# Patient Record
Sex: Male | Born: 1970 | Race: White | Hispanic: No | Smoking: Current some day smoker
Health system: Southern US, Community
[De-identification: ages and names within clinical notes are randomized; demographics above are authoritative.]

## PROBLEM LIST (undated history)

## (undated) DIAGNOSIS — J45909 Unspecified asthma, uncomplicated: Secondary | ICD-10-CM

## (undated) DIAGNOSIS — G4730 Sleep apnea, unspecified: Secondary | ICD-10-CM

## (undated) DIAGNOSIS — I10 Essential (primary) hypertension: Secondary | ICD-10-CM

---

## 1999-12-24 ENCOUNTER — Emergency Department (HOSPITAL_COMMUNITY): Admission: EM | Admit: 1999-12-24 | Discharge: 1999-12-24 | Payer: Self-pay | Admitting: Emergency Medicine

## 2000-07-24 ENCOUNTER — Emergency Department (HOSPITAL_COMMUNITY): Admission: EM | Admit: 2000-07-24 | Discharge: 2000-07-24 | Payer: Self-pay | Admitting: Emergency Medicine

## 2008-06-13 ENCOUNTER — Emergency Department (HOSPITAL_COMMUNITY): Admission: EM | Admit: 2008-06-13 | Discharge: 2008-06-13 | Payer: Self-pay | Admitting: Emergency Medicine

## 2009-05-09 ENCOUNTER — Emergency Department (HOSPITAL_COMMUNITY): Admission: EM | Admit: 2009-05-09 | Discharge: 2009-05-09 | Payer: Self-pay | Admitting: Emergency Medicine

## 2010-03-28 ENCOUNTER — Emergency Department (HOSPITAL_COMMUNITY)
Admission: EM | Admit: 2010-03-28 | Discharge: 2010-03-28 | Payer: Self-pay | Source: Home / Self Care | Admitting: Emergency Medicine

## 2011-08-21 ENCOUNTER — Encounter (HOSPITAL_COMMUNITY): Payer: Self-pay | Admitting: *Deleted

## 2011-08-21 ENCOUNTER — Emergency Department (HOSPITAL_COMMUNITY)
Admission: EM | Admit: 2011-08-21 | Discharge: 2011-08-21 | Disposition: A | Discharge status: Home / Self Care | Payer: Medicaid Other | Attending: Emergency Medicine | Admitting: Emergency Medicine

## 2011-08-21 DIAGNOSIS — J069 Acute upper respiratory infection, unspecified: Secondary | ICD-10-CM

## 2011-08-21 DIAGNOSIS — Z87891 Personal history of nicotine dependence: Secondary | ICD-10-CM | POA: Insufficient documentation

## 2011-08-21 DIAGNOSIS — J3489 Other specified disorders of nose and nasal sinuses: Secondary | ICD-10-CM | POA: Insufficient documentation

## 2011-08-21 DIAGNOSIS — R05 Cough: Secondary | ICD-10-CM | POA: Insufficient documentation

## 2011-08-21 DIAGNOSIS — J40 Bronchitis, not specified as acute or chronic: Secondary | ICD-10-CM

## 2011-08-21 DIAGNOSIS — R059 Cough, unspecified: Secondary | ICD-10-CM | POA: Insufficient documentation

## 2011-08-21 MED ORDER — AZITHROMYCIN 250 MG PO TABS: 500.0000 mg | ORAL_TABLET | Freq: Every day | ORAL | Status: AC

## 2011-08-21 MED ORDER — OXYMETAZOLINE HCL 0.05 % NA SOLN: 2.0000 | Freq: Two times a day (BID) | NASAL | Status: AC

## 2011-08-21 NOTE — ED Provider Notes (Signed)
History     CSN: [id number]  Arrival date & time 08/21/11  1313   First MD Initiated Contact with Patient 08/21/11 1617      Chief Complaint  Patient presents with  . Nasal Congestion  . Cough    (Consider location/radiation/quality/duration/timing/severity/associated sxs/prior treatment) HPI  41 year old male presents with chief complaints of cold symptoms.  Onset was gradual, persistent, and worsening. Symptoms include sinus headache, nasal congestion, sore throat, ear aches, sneezing, coughing productive with yellow sputum, nausea without vomiting or diarrhea. She also complains of chest congestion. He denies fever, chills, myalgias, abdominal pain, back pain or dysuria or rash. Patient has tried Mucinex with minimal improvement. Patient is a smoker but has tried to quit. Patient denies any recent sick contact. Patient denies hemoptysis.   History reviewed. No pertinent past medical history.  History reviewed. No pertinent past surgical history.  No family history on file.  History  Substance Use Topics  . Smoking status: Former Smoker    Quit date: 06/22/2011  . Smokeless tobacco: Not on file  . Alcohol Use: Yes     occasionally      Review of Systems  All other systems reviewed and are negative.    Allergies  Aspirin and Ibuprofen  Home Medications   Current Outpatient Rx  Name Route Sig Dispense Refill  . FEXOFENADINE HCL 180 MG PO TABS Oral Take 180 mg by mouth daily.    . GUAIFENESIN ER 600 MG PO TB12 Oral Take 600 mg by mouth daily.      BP 146/78  Pulse 86  Temp(Src) 98.2 F (36.8 C) (Oral)  SpO2 98%  Physical Exam  Nursing note and vitals reviewed. Constitutional: He appears well-developed and well-nourished. No distress.       Awake, alert, nontoxic appearance  HENT:  Head: Atraumatic. No trismus in the jaw.  Right Ear: Tympanic membrane is injected.  Left Ear: Tympanic membrane is injected.  Nose: Rhinorrhea present. No sinus  tenderness. No epistaxis. Right sinus exhibits frontal sinus tenderness. Left sinus exhibits frontal sinus tenderness.  Mouth/Throat: Uvula is midline and mucous membranes are normal. No uvula swelling. Posterior oropharyngeal erythema present.  Eyes: Conjunctivae are normal. Right eye exhibits no discharge. Left eye exhibits no discharge.  Neck: Normal range of motion. Neck supple.  Cardiovascular: Normal rate and regular rhythm.   Pulmonary/Chest: Effort normal. No respiratory distress. He exhibits no tenderness.  Abdominal: Soft. There is no tenderness. There is no rebound.  Musculoskeletal: He exhibits no tenderness.       ROM appears intact, no obvious focal weakness  Lymphadenopathy:    He has no cervical adenopathy.  Neurological: He is alert.  Skin: Skin is warm and dry. No rash noted.  Psychiatric: He has a normal mood and affect.    ED Course  Procedures (including critical care time)  Labs Reviewed - No data to display No results found.   No diagnosis found.    MDM  Symptoms is suggestive of upper respiratory infection. Patient is afebrile. Since he is a smoker, plan to prescribe Z-Pak. Treatment include gargle with salt water, afrin for nasal congestion.  Pt voice understanding.          Fayrene Helper, PA-C 08/21/11 1630

## 2011-08-21 NOTE — Discharge Instructions (Signed)
Bronchitis Bronchitis is the body's way of reacting to injury and/or infection (inflammation) of the bronchi. Bronchi are the air tubes that extend from the windpipe into the lungs. If the inflammation becomes severe, it may cause shortness of breath. CAUSES  Inflammation may be caused by:  A virus.   Germs (bacteria).   Dust.   Allergens.   Pollutants and many other irritants.  The cells lining the bronchial tree are covered with tiny hairs (cilia). These constantly beat upward, away from the lungs, toward the mouth. This keeps the lungs free of pollutants. When these cells become too irritated and are unable to do their job, mucus begins to develop. This causes the characteristic cough of bronchitis. The cough clears the lungs when the cilia are unable to do their job. Without either of these protective mechanisms, the mucus would settle in the lungs. Then you would develop pneumonia. Smoking is a common cause of bronchitis and can contribute to pneumonia. Stopping this habit is the single most important thing you can do to help yourself. TREATMENT   Your caregiver may prescribe an antibiotic if the cough is caused by bacteria. Also, medicines that open up your airways make it easier to breathe. Your caregiver may also recommend or prescribe an expectorant. It will loosen the mucus to be coughed up. Only take over-the-counter or prescription medicines for pain, discomfort, or fever as directed by your caregiver.   Removing whatever causes the problem (smoking, for example) is critical to preventing the problem from getting worse.   Cough suppressants may be prescribed for relief of cough symptoms.   Inhaled medicines may be prescribed to help with symptoms now and to help prevent problems from returning.   For those with recurrent (chronic) bronchitis, there may be a need for steroid medicines.  SEEK IMMEDIATE MEDICAL CARE IF:   During treatment, you develop more pus-like mucus  (purulent sputum).   You have a fever.   Your baby is older than 3 months with a rectal temperature of 102 F (38.9 C) or higher.   Your baby is 73 months old or younger with a rectal temperature of 100.4 F (38 C) or higher.   You become progressively more ill.   You have increased difficulty breathing, wheezing, or shortness of breath.  It is necessary to seek immediate medical care if you are elderly or sick from any other disease. MAKE SURE YOU:   Understand these instructions.   Will watch your condition.   Will get help right away if you are not doing well or get worse.  Document Released: 04/09/2005 Document Revised: 03/29/2011 Document Reviewed: 02/17/2008 [facility] Patient Information 2012 [place], [place].  Upper Respiratory Infection, Adult An upper respiratory infection (URI) is also sometimes known as the common cold. The upper respiratory tract includes the nose, sinuses, throat, trachea, and bronchi. Bronchi are the airways leading to the lungs. Most people improve within 1 week, but symptoms can last up to 2 weeks. A residual cough may last even longer.  CAUSES Many different viruses can infect the tissues lining the upper respiratory tract. The tissues become irritated and inflamed and often become very moist. Mucus production is also common. A cold is contagious. You can easily spread the virus to others by oral contact. This includes kissing, sharing a glass, coughing, or sneezing. Touching your mouth or nose and then touching a surface, which is then touched by another person, can also spread the virus. SYMPTOMS  Symptoms typically develop 1 to  3 days after you come in contact with a cold virus. Symptoms vary from person to person. They may include:  Runny nose.   Sneezing.   Nasal congestion.   Sinus irritation.   Sore throat.   Loss of voice (laryngitis).   Cough.   Fatigue.   Muscle aches.   Loss of appetite.   Headache.   Low-grade fever.    DIAGNOSIS  You might diagnose your own cold based on familiar symptoms, since most people get a cold 2 to 3 times a year. Your caregiver can confirm this based on your exam. Most importantly, your caregiver can check that your symptoms are not due to another disease such as strep throat, sinusitis, pneumonia, asthma, or epiglottitis. Blood tests, throat tests, and X-rays are not necessary to diagnose a common cold, but they may sometimes be helpful in excluding other more serious diseases. Your caregiver will decide if any further tests are required. RISKS AND COMPLICATIONS  You may be at risk for a more severe case of the common cold if you smoke cigarettes, have chronic heart disease (such as heart failure) or lung disease (such as asthma), or if you have a weakened immune system. The very young and very old are also at risk for more serious infections. Bacterial sinusitis, middle ear infections, and bacterial pneumonia can complicate the common cold. The common cold can worsen asthma and chronic obstructive pulmonary disease (COPD). Sometimes, these complications can require emergency medical care and may be life-threatening. PREVENTION  The best way to protect against getting a cold is to practice good hygiene. Avoid oral or hand contact with people with cold symptoms. Wash your hands often if contact occurs. There is no clear evidence that vitamin C, vitamin E, echinacea, or exercise reduces the chance of developing a cold. However, it is always recommended to get plenty of rest and practice good nutrition. TREATMENT  Treatment is directed at relieving symptoms. There is no cure. Antibiotics are not effective, because the infection is caused by a virus, not by bacteria. Treatment may include:  Increased fluid intake. Sports drinks offer valuable electrolytes, sugars, and fluids.   Breathing heated mist or steam (vaporizer or shower).   Eating chicken soup or other clear broths, and maintaining  good nutrition.   Getting plenty of rest.   Using gargles or lozenges for comfort.   Controlling fevers with ibuprofen or acetaminophen as directed by your caregiver.   Increasing usage of your inhaler if you have asthma.  Zinc gel and zinc lozenges, taken in the first 24 hours of the common cold, can shorten the duration and lessen the severity of symptoms. Pain medicines may help with fever, muscle aches, and throat pain. A variety of non-prescription medicines are available to treat congestion and runny nose. Your caregiver can make recommendations and may suggest nasal or lung inhalers for other symptoms.  HOME CARE INSTRUCTIONS   Only take over-the-counter or prescription medicines for pain, discomfort, or fever as directed by your caregiver.   Use a warm mist humidifier or inhale steam from a shower to increase air moisture. This may keep secretions moist and make it easier to breathe.   Drink enough water and fluids to keep your urine clear or pale yellow.   Rest as needed.   Return to work when your temperature has returned to normal or as your caregiver advises. You may need to stay home longer to avoid infecting others. You can also use a face mask and careful  hand washing to prevent spread of the virus.  SEEK MEDICAL CARE IF:   After the first few days, you feel you are getting worse rather than better.   You need your caregiver's advice about medicines to control symptoms.   You develop chills, worsening shortness of breath, or brown or red sputum. These may be signs of pneumonia.   You develop yellow or brown nasal discharge or pain in the face, especially when you bend forward. These may be signs of sinusitis.   You develop a fever, swollen neck glands, pain with swallowing, or white areas in the back of your throat. These may be signs of strep throat.  SEEK IMMEDIATE MEDICAL CARE IF:   You have a fever.   You develop severe or persistent headache, ear pain, sinus  pain, or chest pain.   You develop wheezing, a prolonged cough, cough up blood, or have a change in your usual mucus (if you have chronic lung disease).   You develop sore muscles or a stiff neck.  Document Released: 10/03/2000 Document Revised: 03/29/2011 Document Reviewed: 08/11/2010 [facility] Patient Information 2012 [place], [place].Upper Respiratory Infection, Adult An upper respiratory infection (URI) is also sometimes known as the common cold. The upper respiratory tract includes the nose, sinuses, throat, trachea, and bronchi. Bronchi are the airways leading to the lungs. Most people improve within 1 week, but symptoms can last up to 2 weeks. A residual cough may last even longer.  CAUSES Many different viruses can infect the tissues lining the upper respiratory tract. The tissues become irritated and inflamed and often become very moist. Mucus production is also common. A cold is contagious. You can easily spread the virus to others by oral contact. This includes kissing, sharing a glass, coughing, or sneezing. Touching your mouth or nose and then touching a surface, which is then touched by another person, can also spread the virus. SYMPTOMS  Symptoms typically develop 1 to 3 days after you come in contact with a cold virus. Symptoms vary from person to person. They may include:  Runny nose.   Sneezing.   Nasal congestion.   Sinus irritation.   Sore throat.   Loss of voice (laryngitis).   Cough.   Fatigue.   Muscle aches.   Loss of appetite.   Headache.   Low-grade fever.  DIAGNOSIS  You might diagnose your own cold based on familiar symptoms, since most people get a cold 2 to 3 times a year. Your caregiver can confirm this based on your exam. Most importantly, your caregiver can check that your symptoms are not due to another disease such as strep throat, sinusitis, pneumonia, asthma, or epiglottitis. Blood tests, throat tests, and X-rays are not necessary to diagnose a  common cold, but they may sometimes be helpful in excluding other more serious diseases. Your caregiver will decide if any further tests are required. RISKS AND COMPLICATIONS  You may be at risk for a more severe case of the common cold if you smoke cigarettes, have chronic heart disease (such as heart failure) or lung disease (such as asthma), or if you have a weakened immune system. The very young and very old are also at risk for more serious infections. Bacterial sinusitis, middle ear infections, and bacterial pneumonia can complicate the common cold. The common cold can worsen asthma and chronic obstructive pulmonary disease (COPD). Sometimes, these complications can require emergency medical care and may be life-threatening. PREVENTION  The best way to protect against getting a cold  is to practice good hygiene. Avoid oral or hand contact with people with cold symptoms. Wash your hands often if contact occurs. There is no clear evidence that vitamin C, vitamin E, echinacea, or exercise reduces the chance of developing a cold. However, it is always recommended to get plenty of rest and practice good nutrition. TREATMENT  Treatment is directed at relieving symptoms. There is no cure. Antibiotics are not effective, because the infection is caused by a virus, not by bacteria. Treatment may include:  Increased fluid intake. Sports drinks offer valuable electrolytes, sugars, and fluids.   Breathing heated mist or steam (vaporizer or shower).   Eating chicken soup or other clear broths, and maintaining good nutrition.   Getting plenty of rest.   Using gargles or lozenges for comfort.   Controlling fevers with ibuprofen or acetaminophen as directed by your caregiver.   Increasing usage of your inhaler if you have asthma.  Zinc gel and zinc lozenges, taken in the first 24 hours of the common cold, can shorten the duration and lessen the severity of symptoms. Pain medicines may help with fever,  muscle aches, and throat pain. A variety of non-prescription medicines are available to treat congestion and runny nose. Your caregiver can make recommendations and may suggest nasal or lung inhalers for other symptoms.  HOME CARE INSTRUCTIONS   Only take over-the-counter or prescription medicines for pain, discomfort, or fever as directed by your caregiver.   Use a warm mist humidifier or inhale steam from a shower to increase air moisture. This may keep secretions moist and make it easier to breathe.   Drink enough water and fluids to keep your urine clear or pale yellow.   Rest as needed.   Return to work when your temperature has returned to normal or as your caregiver advises. You may need to stay home longer to avoid infecting others. You can also use a face mask and careful hand washing to prevent spread of the virus.  SEEK MEDICAL CARE IF:   After the first few days, you feel you are getting worse rather than better.   You need your caregiver's advice about medicines to control symptoms.   You develop chills, worsening shortness of breath, or brown or red sputum. These may be signs of pneumonia.   You develop yellow or brown nasal discharge or pain in the face, especially when you bend forward. These may be signs of sinusitis.   You develop a fever, swollen neck glands, pain with swallowing, or white areas in the back of your throat. These may be signs of strep throat.  SEEK IMMEDIATE MEDICAL CARE IF:   You have a fever.   You develop severe or persistent headache, ear pain, sinus pain, or chest pain.   You develop wheezing, a prolonged cough, cough up blood, or have a change in your usual mucus (if you have chronic lung disease).   You develop sore muscles or a stiff neck.  Document Released: 10/03/2000 Document Revised: 03/29/2011 Document Reviewed: 08/11/2010 [facility] Patient Information 2012 [place], [place].

## 2011-08-21 NOTE — ED Notes (Signed)
Pt reports HA, sinus and chest congestion, cough over last few days. OTC meds with little to no relief.

## 2011-08-21 NOTE — ED Notes (Signed)
Pt. With nasal congestion, head and temple pain, throat pain, coughing.  Pt. Is coughing up greenish sputum.  Denies fever, vomiting, or diarrhea.  Some nausea.

## 2011-08-22 NOTE — ED Provider Notes (Signed)
Medical screening examination/treatment/procedure(s) were performed by non-physician practitioner and as supervising physician I was immediately available for consultation/collaboration.  Ilir Mahrt T Marycatherine Maniscalco, MD 08/22/11 1624 

## 2012-02-07 ENCOUNTER — Other Ambulatory Visit: Payer: Self-pay | Admitting: Internal Medicine

## 2012-02-07 DIAGNOSIS — N281 Cyst of kidney, acquired: Secondary | ICD-10-CM

## 2012-02-11 ENCOUNTER — Ambulatory Visit
Admission: RE | Admit: 2012-02-11 | Discharge: 2012-02-11 | Disposition: A | Discharge status: Home / Self Care | Payer: Medicaid Other | Source: Ambulatory Visit | Attending: Internal Medicine | Admitting: Internal Medicine

## 2012-02-11 DIAGNOSIS — N281 Cyst of kidney, acquired: Secondary | ICD-10-CM

## 2012-08-10 ENCOUNTER — Encounter (HOSPITAL_COMMUNITY): Payer: Self-pay | Admitting: *Deleted

## 2012-08-10 ENCOUNTER — Emergency Department (HOSPITAL_COMMUNITY)
Admission: EM | Admit: 2012-08-10 | Discharge: 2012-08-10 | Disposition: A | Discharge status: Home / Self Care | Payer: Medicaid Other | Attending: Emergency Medicine | Admitting: Emergency Medicine

## 2012-08-10 ENCOUNTER — Emergency Department (HOSPITAL_COMMUNITY): Payer: Medicaid Other

## 2012-08-10 DIAGNOSIS — F17200 Nicotine dependence, unspecified, uncomplicated: Secondary | ICD-10-CM | POA: Insufficient documentation

## 2012-08-10 DIAGNOSIS — L02511 Cutaneous abscess of right hand: Secondary | ICD-10-CM

## 2012-08-10 DIAGNOSIS — T148XXA Other injury of unspecified body region, initial encounter: Secondary | ICD-10-CM

## 2012-08-10 DIAGNOSIS — Z23 Encounter for immunization: Secondary | ICD-10-CM | POA: Insufficient documentation

## 2012-08-10 DIAGNOSIS — L03019 Cellulitis of unspecified finger: Secondary | ICD-10-CM | POA: Insufficient documentation

## 2012-08-10 DIAGNOSIS — W268XXA Contact with other sharp object(s), not elsewhere classified, initial encounter: Secondary | ICD-10-CM | POA: Insufficient documentation

## 2012-08-10 DIAGNOSIS — Y9389 Activity, other specified: Secondary | ICD-10-CM | POA: Insufficient documentation

## 2012-08-10 DIAGNOSIS — Y929 Unspecified place or not applicable: Secondary | ICD-10-CM | POA: Insufficient documentation

## 2012-08-10 DIAGNOSIS — L089 Local infection of the skin and subcutaneous tissue, unspecified: Secondary | ICD-10-CM | POA: Insufficient documentation

## 2012-08-10 DIAGNOSIS — L02519 Cutaneous abscess of unspecified hand: Secondary | ICD-10-CM | POA: Insufficient documentation

## 2012-08-10 HISTORY — DX: Unspecified asthma, uncomplicated: J45.909

## 2012-08-10 MED ORDER — SULFAMETHOXAZOLE-TRIMETHOPRIM 800-160 MG PO TABS: 1.0000 | ORAL_TABLET | Freq: Two times a day (BID) | ORAL | Status: DC

## 2012-08-10 MED ORDER — TETANUS-DIPHTH-ACELL PERTUSSIS 5-2.5-18.5 LF-MCG/0.5 IM SUSP
0.5000 mL | Freq: Once | INTRAMUSCULAR | Status: AC
  Administered 2012-08-10: 0.5 mL via INTRAMUSCULAR
  Filled 2012-08-10: qty 0.5

## 2012-08-10 MED ORDER — OXYCODONE-ACETAMINOPHEN 5-325 MG PO TABS: 1.0000 | ORAL_TABLET | ORAL | Status: DC | PRN

## 2012-08-10 NOTE — ED Notes (Signed)
Pt reports injuring finger on right hand 1 week ago while doing yard work; pt reports purulent matter and edema that is not improving

## 2012-08-10 NOTE — ED Provider Notes (Signed)
History     CSN: [id number]  Arrival date & time 08/10/12  1220   First MD Initiated Contact with Patient 08/10/12 1225      Chief Complaint  Patient presents with  . Finger Injury    (Consider location/radiation/quality/duration/timing/severity/associated sxs/prior treatment) HPI Comments: Patient presents to the ED for a right hand injury. Patient reports that he was doing yard work last week and a piece of a tree branch poked into his right ring finger. Bleeding was well-controlled after the incident. For the following 2 days there was a small amount of purulent drainage which has resolved at this time. Patient continues to have localized swelling and erythema of the finger. Denies any fever, sweats, or chills.  The patient notes intermittent paresthesias of the puncture site.  Normal range of motion of finger, sensation intact.  Patient's tetanus is not up-to-date.  The history is provided by the patient.    Past Medical History  Diagnosis Date  . Asthma     History reviewed. No pertinent past surgical history.  History reviewed. No pertinent family history.  History  Substance Use Topics  . Smoking status: Current Some Day Smoker    Types: Cigarettes    Last Attempt to Quit: 06/22/2011  . Smokeless tobacco: Never Used  . Alcohol Use: No     Comment: occasionally      Review of Systems  Skin: Positive for wound.  All other systems reviewed and are negative.    Allergies  Aspirin and Ibuprofen  Home Medications   Current Outpatient Rx  Name  Route  Sig  Dispense  Refill  . diphenhydrAMINE (BENADRYL) 25 MG tablet   Oral   Take 25 mg by mouth every 6 (six) hours as needed for itching.           BP 138/80  Pulse 76  Temp(Src) 97.9 F (36.6 C) (Oral)  Resp 20  SpO2 95%  Physical Exam  Nursing note and vitals reviewed. Constitutional: He is oriented to person, place, and time. He appears well-developed and well-nourished.  HENT:  Head:  Normocephalic and atraumatic.  Eyes: Conjunctivae and EOM are normal.  Neck: Normal range of motion.  Cardiovascular: Normal rate, regular rhythm and normal heart sounds.   Pulmonary/Chest: Effort normal and breath sounds normal.  Musculoskeletal: Normal range of motion.       Hands: Neurological: He is alert and oriented to person, place, and time.  Skin: Skin is warm and dry.  Psychiatric: He has a normal mood and affect.    ED Course  Procedures (including critical care time)  INCISION AND DRAINAGE Performed by: Garlon Hatchet Consent: Verbal consent obtained. Risks and benefits: risks, benefits and alternatives were discussed Type: abscess  Body area: right lateral ring finger  Anesthesia: digital block  Incision was made with a scalpel.  Local anesthetic: lidocaine 1% without epinephrine  Anesthetic total: 4 ml  Complexity: complex Blunt dissection to break up loculations  Drainage: purulent/bloody Small splinter retrieved, no other FB noted  Drainage amount: small  Packing material: none  Patient tolerance: Patient tolerated the procedure well with no immediate complications.    Labs Reviewed - No data to display Dg Finger Ring Right  08/10/2012  *RADIOLOGY REPORT*  Clinical Data: Injury to finger, swelling  RIGHT RING FINGER 2+V  Comparison: None.  Findings: 4 mm vaguely radiopaque density adjacent to the proximal aspect of the middle phalanx.  Overlying soft tissue swelling.  No fracture or dislocation is seen.  IMPRESSION: 4 mm vaguely radiopaque density adjacent to the proximal aspect of the middle phalanx.  Overlying soft tissue swelling.   Original Report Authenticated By: Charline Bills, M.D.      1. Abscess of finger of right hand   2. Splinter       MDM  Pt is s/p right ring finger puncture wound from tree branch while doing yard work.  Localized swelling and erythema present.  X-ray as above.  Small amount of purulent drainage expelled.   Small splinter retrieved which may correlate with the density noted on x-ray, no other foreign bodies noted. Tetanus shot given in the ED. Patient will be started on bactrim, percocet given for pain.  Instructed to keep area clean and dry. Followup with primary care physician if symptoms do not improve. Discussed plan with patient and, he agreed.  Return precautions advised.        Garlon Hatchet, PA-C 08/10/12 1843  Medical screening examination/treatment/procedure(s) were performed by non-physician practitioner and as supervising physician I was immediately available for consultation/collaboration.  Derwood Kaplan, MD 08/11/12 [phone]

## 2013-07-13 ENCOUNTER — Emergency Department (HOSPITAL_COMMUNITY)
Admission: EM | Admit: 2013-07-13 | Discharge: 2013-07-13 | Disposition: A | Discharge status: Home / Self Care | Payer: Medicaid Other | Attending: Emergency Medicine | Admitting: Emergency Medicine

## 2013-07-13 ENCOUNTER — Encounter (HOSPITAL_COMMUNITY): Payer: Self-pay | Admitting: Emergency Medicine

## 2013-07-13 DIAGNOSIS — F17200 Nicotine dependence, unspecified, uncomplicated: Secondary | ICD-10-CM | POA: Insufficient documentation

## 2013-07-13 DIAGNOSIS — K645 Perianal venous thrombosis: Secondary | ICD-10-CM | POA: Insufficient documentation

## 2013-07-13 DIAGNOSIS — K649 Unspecified hemorrhoids: Secondary | ICD-10-CM | POA: Insufficient documentation

## 2013-07-13 DIAGNOSIS — J45909 Unspecified asthma, uncomplicated: Secondary | ICD-10-CM | POA: Insufficient documentation

## 2013-07-13 DIAGNOSIS — K648 Other hemorrhoids: Secondary | ICD-10-CM

## 2013-07-13 MED ORDER — LIDOCAINE-EPINEPHRINE 2 %-1:100000 IJ SOLN
INTRAMUSCULAR | Status: AC
  Filled 2013-07-13: qty 1

## 2013-07-13 MED ORDER — OXYCODONE-ACETAMINOPHEN 5-325 MG PO TABS
1.0000 | ORAL_TABLET | Freq: Once | ORAL | Status: AC
  Administered 2013-07-13: 1 via ORAL
  Filled 2013-07-13: qty 1

## 2013-07-13 MED ORDER — OXYCODONE-ACETAMINOPHEN 5-325 MG PO TABS: 2.0000 | ORAL_TABLET | ORAL | Status: DC | PRN

## 2013-07-13 MED ORDER — DOCUSATE SODIUM 100 MG PO CAPS: 100.0000 mg | ORAL_CAPSULE | Freq: Two times a day (BID) | ORAL | Status: AC

## 2013-07-13 NOTE — ED Notes (Signed)
Bed: WA24 Expected date:  Expected time:  Means of arrival:  Comments: 

## 2013-07-13 NOTE — ED Notes (Signed)
Pt states the he has chronic hemorrhoids. He says that on Friday, one burst and the others flared up.

## 2013-07-13 NOTE — ED Provider Notes (Signed)
CSN: [id number]     Arrival date & time 07/13/13  [id number] History   First MD Initiated Contact with Patient 07/13/13 0308     Chief Complaint  Patient presents with  . Hemorrhoids     (Consider location/radiation/quality/duration/timing/severity/associated sxs/prior Treatment) HPI Patient has history of chronic hemorrhoids and states that he's had increasing pain since Friday. He's had intermittent bleeding from the hemorrhoids. Exam difficult to sit down due to pain. He denies fevers or chills. He is unable to reduce his hemorrhoids. Past Medical History  Diagnosis Date  . Asthma    No past surgical history on file. No family history on file. History  Substance Use Topics  . Smoking status: Current Some Day Smoker    Types: Cigarettes    Last Attempt to Quit: 06/22/2011  . Smokeless tobacco: Never Used  . Alcohol Use: No     Comment: occasionally    Review of Systems  Constitutional: Negative for fever and chills.  Gastrointestinal: Positive for anal bleeding and rectal pain. Negative for nausea and vomiting.  Musculoskeletal: Negative for back pain and myalgias.  Skin: Negative for rash and wound.  All other systems reviewed and are negative.      Allergies  Aspirin and Ibuprofen  Home Medications   Current Outpatient Rx  Name  Route  Sig  Dispense  Refill  . diphenhydrAMINE (BENADRYL) 25 MG tablet   Oral   Take 25 mg by mouth every 6 (six) hours as needed for itching.         Marland Kitchen. oxyCODONE-acetaminophen (PERCOCET/ROXICET) 5-325 MG per tablet   Oral   Take 1 tablet by mouth every 4 (four) hours as needed for pain.   10 tablet   0   . sulfamethoxazole-trimethoprim (SEPTRA DS) 800-160 MG per tablet   Oral   Take 1 tablet by mouth every 12 (twelve) hours.   20 tablet   0    BP 150/109  Pulse 89  Temp(Src) 97.9 F (36.6 C) (Oral)  Resp 19  Ht 6' (1.829 m)  Wt 245 lb (111.131 kg)  BMI 33.22 kg/m2  SpO2 97% Physical Exam  Constitutional: He is  oriented to person, place, and time. He appears well-developed and well-nourished.  Patient appears uncomfortable   HENT:  Head: Normocephalic and atraumatic.  Neck: Normal range of motion. Neck supple.  Cardiovascular: Normal rate.   Pulmonary/Chest: Effort normal.  Abdominal: Soft. He exhibits no distension and no mass. There is no tenderness. There is no rebound and no guarding.  Genitourinary:  And the 2:00 position patient has dark colored hemorrhoid that appears to be thrombosed. It is tender to palpation. He has several other large hemorrhoids that are nontender. There is no active bleeding.  Musculoskeletal: Normal range of motion. He exhibits no edema and no tenderness.  Neurological: He is alert and oriented to person, place, and time.  Skin: Skin is warm and dry. No rash noted. No erythema. No pallor.    ED Course  Thrombectomy Date/Time: 07/13/2013 4:26 AM Performed by: Loren RacerYELVERTON, Odeth Bry Authorized by: Loren RacerYELVERTON, Jerrid Forgette Consent: Verbal consent obtained. Preparation: Patient was prepped and draped in the usual sterile fashion. Local anesthesia used: yes Anesthesia: local infiltration Local anesthetic: lidocaine 2% with epinephrine Anesthetic total: 4 ml Patient sedated: no Patient tolerance: Patient tolerated the procedure well with no immediate complications. Comments: Elliptical incision made into the hemorrhoid at the 2:00 position. Minimal clot removed. The hemorrhoid was reduced. Bleeding controlled.   (including critical care time) Labs Review  Labs Reviewed - No data to display Imaging Review No results found.   EKG Interpretation None     .  MDM   Final diagnoses:  None    As well as the patient he'll need followup with a surgeon for possible hemorrhoidectomy. Advised to keep the area clean with sitz baths. He also is advised not to strain and will be started on a stool softener. Return precautions given and patient voiced understanding.    Loren Racer, MD 07/13/13 [phone]

## 2013-07-13 NOTE — ED Notes (Signed)
Pt states he will call wife to come pick him up due to administration of narcotic.

## 2013-07-13 NOTE — Discharge Instructions (Signed)
Hemorrhoids Hemorrhoids are swollen veins around the rectum or anus. There are two types of hemorrhoids:   Internal hemorrhoids. These occur in the veins just inside the rectum. They may poke through to the outside and become irritated and painful.  External hemorrhoids. These occur in the veins outside the anus and can be felt as a painful swelling or hard lump near the anus. CAUSES  Pregnancy.   Obesity.   Constipation or diarrhea.   Straining to have a bowel movement.   Sitting for long periods on the toilet.  Heavy lifting or other activity that caused you to strain.  Anal intercourse. SYMPTOMS   Pain.   Anal itching or irritation.   Rectal bleeding.   Fecal leakage.   Anal swelling.   One or more lumps around the anus.  DIAGNOSIS  Your caregiver may be able to diagnose hemorrhoids by visual examination. Other examinations or tests that may be performed include:   Examination of the rectal area with a gloved hand (digital rectal exam).   Examination of anal canal using a small tube (scope).   A blood test if you have lost a significant amount of blood.  A test to look inside the colon (sigmoidoscopy or colonoscopy). TREATMENT Most hemorrhoids can be treated at home. However, if symptoms do not seem to be getting better or if you have a lot of rectal bleeding, your caregiver may perform a procedure to help make the hemorrhoids get smaller or remove them completely. Possible treatments include:   Placing a rubber band at the base of the hemorrhoid to cut off the circulation (rubber band ligation).   Injecting a chemical to shrink the hemorrhoid (sclerotherapy).   Using a tool to burn the hemorrhoid (infrared light therapy).   Surgically removing the hemorrhoid (hemorrhoidectomy).   Stapling the hemorrhoid to block blood flow to the tissue (hemorrhoid stapling).  HOME CARE INSTRUCTIONS   Eat foods with fiber, such as whole grains, beans,  nuts, fruits, and vegetables. Ask your doctor about taking products with added fiber in them (fibersupplements).  Increase fluid intake. Drink enough water and fluids to keep your urine clear or pale yellow.   Exercise regularly.   Go to the bathroom when you have the urge to have a bowel movement. Do not wait.   Avoid straining to have bowel movements.   Keep the anal area dry and clean. Use wet toilet paper or moist towelettes after a bowel movement.   Medicated creams and suppositories may be used or applied as directed.   Only take over-the-counter or prescription medicines as directed by your caregiver.   Take warm sitz baths for 15 20 minutes, 3 4 times a day to ease pain and discomfort.   Place ice packs on the hemorrhoids if they are tender and swollen. Using ice packs between sitz baths may be helpful.   Put ice in a plastic bag.   Place a towel between your skin and the bag.   Leave the ice on for 15 20 minutes, 3 4 times a day.   Do not use a donut-shaped pillow or sit on the toilet for long periods. This increases blood pooling and pain.  SEEK MEDICAL CARE IF:  You have increasing pain and swelling that is not controlled by treatment or medicine.  You have uncontrolled bleeding.  You have difficulty or you are unable to have a bowel movement.  You have pain or inflammation outside the area of the hemorrhoids. MAKE SURE YOU:    Understand these instructions.  Will watch your condition.  Will get help right away if you are not doing well or get worse. Document Released: 04/06/2000 Document Revised: 03/26/2012 Document Reviewed: 02/12/2012 [employer] Patient Information 2014 [employer]. Hemorrhoidectomy Hemorrhoidectomy is surgery to remove hemorrhoids. Hemorrhoids are veins that have become swollen in the rectum. The rectum is the area from the bottom end of the intestines to the opening where bowel movements leave the body. Hemorrhoids can be  uncomfortable. They can cause itching, bleeding and pain if a blood clot forms in them (thrombose). If hemorrhoids are small, surgery may not be needed. But if they cover a larger area, surgery is usually suggested.  LET YOUR CAREGIVER KNOW ABOUT:   Any allergies.  All medications you are taking, including:  Herbs, eyedrops, over-the-counter medications and creams.  Blood thinners (anticoagulants), aspirin or other drugs that could affect blood clotting.  Use of steroids (by mouth or as creams).  Previous problems with anesthetics, including local anesthetics.  Possibility of pregnancy, if this applies.  Any history of blood clots.  Any history of bleeding or other blood problems.  Previous surgery.  Smoking history.  Other health problems. RISKS AND COMPLICATIONS All surgery carries some risk. However, hemorrhoid surgery usually goes smoothly. Possible complications could include:  Urinary retention.  Bleeding.  Infection.  A painful incision.  A reaction to the anesthesia (this is not common). BEFORE THE PROCEDURE   Stop using aspirin and non-steroidal anti-inflammatory drugs (NSAIDs) for pain relief. This includes prescription drugs and over-the-counter drugs such as ibuprofen and naproxen. Also stop taking vitamin E. If possible, do this two weeks before your surgery.  If you take blood-thinners, ask your healthcare provider when you should stop taking them.  You will probably have blood and urine tests done several days before your surgery.  Do not eat or drink for about 8 hours before the surgery.  Arrive at least an hour before the surgery, or whenever your surgeon recommends. This will give you time to check in and fill out any needed paperwork.  Hemorrhoidectomy is often an outpatient procedure. This means you will be able to go home the same day. Sometimes, though, people stay overnight in the hospital after the procedure. Ask your surgeon what to expect.  Either way, make arrangements in advance for someone to drive you home. PROCEDURE   The preparation:  You will change into a hospital gown.  You will be given an IV. A needle will be inserted in your arm. Medication can flow directly into your body through this needle.  You might be given an enema to clear your rectum.  Once in the operating room, you will probably lie on your side or be repositioned later to lying on your stomach.  You will be given anesthesia (medication) so you will not feel anything during the surgery. The surgery often is done with local anesthesia (the area near the hemorrhoids will be numb and you will be drowsy but awake). Sometimes, general anesthesia is used (you will be asleep during the procedure).  The procedure:  There are a few different procedures for hemorrhoids. Be sure to ask you surgeon about the procedure, the risks and benefits.  Be sure to ask about what you need to do to take care of the wound, if there is one. AFTER THE PROCEDURE  You will stay in a recovery area until the anesthesia has worn off. Your blood pressure and pulse will be checked every so often.    You may feel a lot of pain in the area of the rectum.  Take all pain medication prescribed by your surgeon. Ask before taking any over-the-counter pain medicines.  Sometimes sitting in a warm bath can help relieve your pain.  To make sure you have bowel movements without straining:  You will probably need to take stool softeners (usually a pill) for a few days.  You should drink 8 to 10 glasses of water each day.  Your activity will be restricted for awhile. Ask your caregiver for a list of what you should and should not do while you recover. Document Released: 02/04/2009 Document Revised: 07/02/2011 Document Reviewed: 02/04/2009 [employer] Patient Information 2014 [employer].  

## 2013-07-14 ENCOUNTER — Encounter (INDEPENDENT_AMBULATORY_CARE_PROVIDER_SITE_OTHER): Payer: Self-pay | Admitting: General Surgery

## 2013-07-15 ENCOUNTER — Encounter (HOSPITAL_COMMUNITY): Payer: Self-pay | Admitting: *Deleted

## 2013-07-15 ENCOUNTER — Encounter (INDEPENDENT_AMBULATORY_CARE_PROVIDER_SITE_OTHER): Payer: Self-pay | Admitting: General Surgery

## 2013-07-15 ENCOUNTER — Observation Stay (HOSPITAL_COMMUNITY)
Admission: AD | Admit: 2013-07-15 | Discharge: 2013-07-16 | Disposition: A | Discharge status: Home / Self Care | Payer: Medicaid Other | Source: Ambulatory Visit | Attending: General Surgery | Admitting: General Surgery

## 2013-07-15 ENCOUNTER — Ambulatory Visit (INDEPENDENT_AMBULATORY_CARE_PROVIDER_SITE_OTHER): Payer: Medicaid Other | Admitting: General Surgery

## 2013-07-15 VITALS — BP 126/78 | HR 78 | Temp 98.4°F | Resp 16 | Ht 71.0 in | Wt 235.0 lb

## 2013-07-15 DIAGNOSIS — K648 Other hemorrhoids: Secondary | ICD-10-CM | POA: Insufficient documentation

## 2013-07-15 DIAGNOSIS — K643 Fourth degree hemorrhoids: Secondary | ICD-10-CM | POA: Insufficient documentation

## 2013-07-15 DIAGNOSIS — J45909 Unspecified asthma, uncomplicated: Secondary | ICD-10-CM | POA: Insufficient documentation

## 2013-07-15 DIAGNOSIS — F17200 Nicotine dependence, unspecified, uncomplicated: Secondary | ICD-10-CM | POA: Insufficient documentation

## 2013-07-15 DIAGNOSIS — K644 Residual hemorrhoidal skin tags: Secondary | ICD-10-CM | POA: Insufficient documentation

## 2013-07-15 DIAGNOSIS — I10 Essential (primary) hypertension: Secondary | ICD-10-CM | POA: Insufficient documentation

## 2013-07-15 DIAGNOSIS — K645 Perianal venous thrombosis: Principal | ICD-10-CM | POA: Insufficient documentation

## 2013-07-15 HISTORY — DX: Essential (primary) hypertension: I10

## 2013-07-15 HISTORY — DX: Sleep apnea, unspecified: G47.30

## 2013-07-15 LAB — BASIC METABOLIC PANEL
BUN: 15 mg/dL (ref 6–23)
CALCIUM: 9.7 mg/dL (ref 8.4–10.5)
CO2: 28 mEq/L (ref 19–32)
Chloride: 102 mEq/L (ref 96–112)
Creatinine, Ser: 0.98 mg/dL (ref 0.50–1.35)
GFR calc Af Amer: >90 mL/min (ref >90)
Glucose, Bld: 114 mg/dL — ABNORMAL HIGH (ref 70–99)
Potassium: 4.4 mEq/L (ref 3.7–5.3)
Sodium: 140 mEq/L (ref 137–147)

## 2013-07-15 LAB — CBC
HCT: 40 % (ref 39.0–52.0)
Hemoglobin: 13.8 g/dL (ref 13.0–17.0)
MCH: 31.7 pg (ref 26.0–34.0)
MCHC: 34.5 g/dL (ref 30.0–36.0)
MCV: 92 fL (ref 78.0–100.0)
PLATELETS: 215 10*3/uL (ref 150–400)
RBC: 4.35 MIL/uL (ref 4.22–5.81)
RDW: 12.6 % (ref 11.5–15.5)
WBC: 5.2 10*3/uL (ref 4.0–10.5)

## 2013-07-15 MED ORDER — POLYETHYLENE GLYCOL 3350 17 G PO PACK
17.0000 g | PACK | Freq: Two times a day (BID) | ORAL | Status: DC
  Administered 2013-07-15: 17 g via ORAL
  Filled 2013-07-15 (×3): qty 1

## 2013-07-15 MED ORDER — DIAZEPAM 5 MG PO TABS
5.0000 mg | ORAL_TABLET | Freq: Four times a day (QID) | ORAL | Status: DC | PRN
  Administered 2013-07-16: 5 mg via ORAL
  Filled 2013-07-15: qty 1

## 2013-07-15 MED ORDER — KCL IN DEXTROSE-NACL 20-5-0.9 MEQ/L-%-% IV SOLN
INTRAVENOUS | Status: DC
  Administered 2013-07-15 – 2013-07-16 (×3): via INTRAVENOUS
  Filled 2013-07-15 (×5): qty 1000

## 2013-07-15 MED ORDER — ONDANSETRON HCL 4 MG/2ML IJ SOLN
4.0000 mg | Freq: Four times a day (QID) | INTRAMUSCULAR | Status: DC | PRN
  Administered 2013-07-16: 4 mg via INTRAVENOUS
  Filled 2013-07-15: qty 2

## 2013-07-15 MED ORDER — LIDOCAINE 5 % EX OINT: 1.0000 "application " | TOPICAL_OINTMENT | CUTANEOUS | Status: AC | PRN

## 2013-07-15 MED ORDER — DOCUSATE SODIUM 100 MG PO CAPS
100.0000 mg | ORAL_CAPSULE | Freq: Three times a day (TID) | ORAL | Status: DC
  Administered 2013-07-15 (×2): 100 mg via ORAL
  Filled 2013-07-15 (×5): qty 1

## 2013-07-15 MED ORDER — DIAZEPAM 5 MG PO TABS: 5.0000 mg | ORAL_TABLET | Freq: Four times a day (QID) | ORAL | Status: AC | PRN

## 2013-07-15 MED ORDER — ENOXAPARIN SODIUM 40 MG/0.4ML ~~LOC~~ SOLN
40.0000 mg | SUBCUTANEOUS | Status: DC
  Administered 2013-07-15: 40 mg via SUBCUTANEOUS
  Filled 2013-07-15 (×2): qty 0.4

## 2013-07-15 MED ORDER — SODIUM CHLORIDE 0.9 % IJ SOLN
3.0000 mL | Freq: Two times a day (BID) | INTRAMUSCULAR | Status: DC
  Administered 2013-07-15: 3 mL via INTRAVENOUS

## 2013-07-15 MED ORDER — OXYCODONE HCL 5 MG PO TABS
5.0000 mg | ORAL_TABLET | ORAL | Status: DC | PRN
  Administered 2013-07-15 – 2013-07-16 (×3): 5 mg via ORAL
  Filled 2013-07-15 (×3): qty 1

## 2013-07-15 MED ORDER — SODIUM CHLORIDE 0.9 % IJ SOLN: 3.0000 mL | INTRAMUSCULAR | Status: DC | PRN

## 2013-07-15 MED ORDER — LIDOCAINE 5 % EX OINT
TOPICAL_OINTMENT | Freq: Four times a day (QID) | CUTANEOUS | Status: DC | PRN
  Filled 2013-07-15: qty 35.44

## 2013-07-15 NOTE — Progress Notes (Signed)
Chief Complaint  Patient presents with  . Hemorrhoids    new pt    HISTORY: Tyler Dawson is a 43 y.o. male who presents to the office with anal pain.  He started having pain on Fri.  He went to the ED on Sun and had a thrombosed hemorrhoid lanced.  Other symptoms include bleeding, swelling and pressure.  He has tried tub soaks so far.  Nothing makes the symptoms worse.   It is continuous in nature.  His bowel habits are irregular and his bowel movements are usually hard, requiring straining occasionally.  His fiber intake is dietary.  He has never had a colonoscopy.    Past Medical History  Diagnosis Date  . Asthma       History reviewed. No pertinent past surgical history.      Current Outpatient Prescriptions  Medication Sig Dispense Refill  . docusate sodium (COLACE) 100 MG capsule Take 1 capsule (100 mg total) by mouth every 12 (twelve) hours.  60 capsule  0  . oxyCODONE-acetaminophen (PERCOCET) 5-325 MG per tablet Take 2 tablets by mouth every 4 (four) hours as needed.  20 tablet  0  . loratadine (CLARITIN) 10 MG tablet Take 10 mg by mouth daily as needed for allergies.      Marland Kitchen petrolatum-hydrophilic-aloe vera (ALOE VESTA) ointment Apply 1 application topically 2 (two) times daily as needed (hemorrhoids).       No current facility-administered medications for this visit.      Allergies  Allergen Reactions  . Aspirin Anaphylaxis and Hives  . Ibuprofen Anaphylaxis and Hives      Family History  Problem Relation Age of Onset  . Cancer Mother     History   Social History  . Marital Status: Divorced    Spouse Name: N/A    Number of Children: N/A  . Years of Education: N/A   Social History Main Topics  . Smoking status: Current Some Day Smoker    Types: Cigarettes    Last Attempt to Quit: 06/22/2011  . Smokeless tobacco: Never Used  . Alcohol Use: No     Comment: occasionally  . Drug Use: No  . Sexual Activity: None   Other Topics Concern  . None    Social History Narrative  . None      REVIEW OF SYSTEMS - PERTINENT POSITIVES ONLY: Review of Systems - General ROS: negative for - chills, fever or weight loss Hematological and Lymphatic ROS: negative for - bleeding problems, blood clots or bruising Respiratory ROS: no cough, shortness of breath, or wheezing Cardiovascular ROS: no chest pain or dyspnea on exertion Gastrointestinal ROS: positive for - abdominal pain, blood in stools and constipation negative for - nausea/vomiting Genito-Urinary ROS: no dysuria, trouble voiding, or hematuria  EXAM: Filed Vitals:   07/15/13 1106  BP: 126/78  Pulse: 78  Temp: 98.4 F (36.9 C)  Resp: 16    General appearance: alert and cooperative Resp: clear to auscultation bilaterally Cardio: regular rate and rhythm GI: normal findings: soft, non-tender  Anal Exam Findings: Necrotic, thrombosed internal hemorrhoid, edematous and enlarged left lateral external hemorrhoids. I do not see any obvious external thrombosis. Unable to perform a internal exam due to pain.    ASSESSMENT AND PLAN: Tyler Dawson Is a 43 year old male who presents to the office with a grade 4 internal hemorrhoid. We discussed the risk of doing nothing versus surgical resection. We discussed the surgical resection will usually make the pain worse before it's better. We  discussed the risk of necrosis and sepsis if no operation is performed. He would like to get this done as soon as possible. I discussed this with Dr. Carolynne [facility]oth [facility]. He is agreeable to debriding this in the operating room tomorrow. We will get him admitted to the hospital and on clear liquids and MiraLax and sitz bath to assist with swelling and pain.    Vanita PandaAlicia C Savera Donson, MD Colon and Rectal Surgery / General Surgery [facility]      Visit Diagnoses: No diagnosis found.  Primary Care Physician: Jackie PlumSEI-BONSU,GEORGE, MD

## 2013-07-15 NOTE — Patient Instructions (Signed)
We will call you when a bed is available [facility]. You may drink clear liquids today. Nothing to eat or drink after midnight tonight.

## 2013-07-16 ENCOUNTER — Encounter (HOSPITAL_COMMUNITY): Admission: AD | Disposition: A | Discharge status: Home / Self Care | Payer: Self-pay | Source: Ambulatory Visit

## 2013-07-16 ENCOUNTER — Observation Stay (HOSPITAL_COMMUNITY): Payer: Medicaid Other | Admitting: Anesthesiology

## 2013-07-16 ENCOUNTER — Encounter (HOSPITAL_COMMUNITY): Payer: Self-pay

## 2013-07-16 ENCOUNTER — Encounter (HOSPITAL_COMMUNITY): Payer: Medicaid Other | Admitting: Anesthesiology

## 2013-07-16 DIAGNOSIS — K648 Other hemorrhoids: Secondary | ICD-10-CM

## 2013-07-16 DIAGNOSIS — K644 Residual hemorrhoidal skin tags: Secondary | ICD-10-CM

## 2013-07-16 HISTORY — PX: HEMORRHOID SURGERY: SHX153

## 2013-07-16 LAB — SURGICAL PCR SCREEN
MRSA, PCR: NEGATIVE
Staphylococcus aureus: NEGATIVE

## 2013-07-16 SURGERY — HEMORRHOIDECTOMY
Anesthesia: General

## 2013-07-16 MED ORDER — OXYCODONE HCL 5 MG PO TABS: ORAL_TABLET | ORAL | Status: DC

## 2013-07-16 MED ORDER — HYALURONIDASE HUMAN 150 UNIT/ML IJ SOLN
INTRAMUSCULAR | Status: AC
  Filled 2013-07-16: qty 1

## 2013-07-16 MED ORDER — DIAZEPAM 2 MG PO TABS: 5.0000 mg | ORAL_TABLET | Freq: Four times a day (QID) | ORAL | Status: DC | PRN

## 2013-07-16 MED ORDER — METOCLOPRAMIDE HCL 5 MG/ML IJ SOLN
INTRAMUSCULAR | Status: AC
  Filled 2013-07-16: qty 2

## 2013-07-16 MED ORDER — MIDAZOLAM HCL 5 MG/5ML IJ SOLN
INTRAMUSCULAR | Status: DC | PRN
  Administered 2013-07-16 (×2): 1 mg via INTRAVENOUS

## 2013-07-16 MED ORDER — LACTATED RINGERS IV SOLN: INTRAVENOUS | Status: DC

## 2013-07-16 MED ORDER — PROPOFOL 10 MG/ML IV BOLUS
INTRAVENOUS | Status: DC | PRN
  Administered 2013-07-16: 200 mg via INTRAVENOUS

## 2013-07-16 MED ORDER — POLYETHYLENE GLYCOL 3350 17 G PO PACK: PACK | ORAL | Status: AC

## 2013-07-16 MED ORDER — DOCUSATE SODIUM 100 MG PO CAPS
100.0000 mg | ORAL_CAPSULE | Freq: Two times a day (BID) | ORAL | Status: DC
  Filled 2013-07-16 (×3): qty 1

## 2013-07-16 MED ORDER — DIPHENHYDRAMINE HCL 50 MG/ML IJ SOLN
25.0000 mg | Freq: Once | INTRAMUSCULAR | Status: AC
  Administered 2013-07-16: 25 mg via INTRAVENOUS

## 2013-07-16 MED ORDER — OXYCODONE-ACETAMINOPHEN 5-325 MG PO TABS: 2.0000 | ORAL_TABLET | ORAL | Status: DC | PRN

## 2013-07-16 MED ORDER — DIBUCAINE 1 % RE OINT
TOPICAL_OINTMENT | RECTAL | Status: AC
  Filled 2013-07-16: qty 28

## 2013-07-16 MED ORDER — EPHEDRINE SULFATE 50 MG/ML IJ SOLN
INTRAMUSCULAR | Status: AC
  Filled 2013-07-16: qty 1

## 2013-07-16 MED ORDER — FENTANYL CITRATE 0.05 MG/ML IJ SOLN
INTRAMUSCULAR | Status: DC | PRN
  Administered 2013-07-16 (×2): 50 ug via INTRAVENOUS

## 2013-07-16 MED ORDER — MORPHINE SULFATE 10 MG/ML IJ SOLN
4.0000 mg | INTRAMUSCULAR | Status: DC | PRN
  Administered 2013-07-16: 4 mg via INTRAVENOUS

## 2013-07-16 MED ORDER — SODIUM CHLORIDE 0.9 % IJ SOLN
INTRAMUSCULAR | Status: AC
  Filled 2013-07-16: qty 10

## 2013-07-16 MED ORDER — LIDOCAINE HCL (CARDIAC) 20 MG/ML IV SOLN
INTRAVENOUS | Status: DC | PRN
  Administered 2013-07-16: 75 mg via INTRAVENOUS

## 2013-07-16 MED ORDER — LACTATED RINGERS IV SOLN
INTRAVENOUS | Status: DC
  Administered 2013-07-16: 1000 mL via INTRAVENOUS

## 2013-07-16 MED ORDER — ONDANSETRON HCL 4 MG/2ML IJ SOLN
INTRAMUSCULAR | Status: DC | PRN
  Administered 2013-07-16: 4 mg via INTRAVENOUS

## 2013-07-16 MED ORDER — METOCLOPRAMIDE HCL 5 MG/ML IJ SOLN
INTRAMUSCULAR | Status: DC | PRN
  Administered 2013-07-16: 5 mg via INTRAVENOUS

## 2013-07-16 MED ORDER — CEFOXITIN SODIUM 2 G IV SOLR
2.0000 g | Freq: Once | INTRAVENOUS | Status: AC
  Administered 2013-07-16: 2 g via INTRAVENOUS

## 2013-07-16 MED ORDER — ACETAMINOPHEN 10 MG/ML IV SOLN
1000.0000 mg | Freq: Once | INTRAVENOUS | Status: AC
  Administered 2013-07-16: 1000 mg via INTRAVENOUS
  Filled 2013-07-16: qty 100

## 2013-07-16 MED ORDER — BUPIVACAINE-EPINEPHRINE 0.25% -1:200000 IJ SOLN
INTRAMUSCULAR | Status: DC | PRN
  Administered 2013-07-16: 19 mL

## 2013-07-16 MED ORDER — DEXAMETHASONE SODIUM PHOSPHATE 10 MG/ML IJ SOLN
INTRAMUSCULAR | Status: DC | PRN
  Administered 2013-07-16: 10 mg via INTRAVENOUS

## 2013-07-16 MED ORDER — DIAZEPAM 5 MG PO TABS: 5.0000 mg | ORAL_TABLET | Freq: Four times a day (QID) | ORAL | Status: AC | PRN

## 2013-07-16 MED ORDER — DIBUCAINE 1 % RE OINT: TOPICAL_OINTMENT | RECTAL | Status: DC | PRN

## 2013-07-16 MED ORDER — MIDAZOLAM HCL 2 MG/2ML IJ SOLN
INTRAMUSCULAR | Status: AC
  Filled 2013-07-16: qty 2

## 2013-07-16 MED ORDER — HYALURONIDASE OVINE 200 UNIT/ML IJ SOLN
INTRAMUSCULAR | Status: DC | PRN
  Administered 2013-07-16: 150 [IU] via SUBCUTANEOUS

## 2013-07-16 MED ORDER — BUPIVACAINE-EPINEPHRINE PF 0.25-1:200000 % IJ SOLN
INTRAMUSCULAR | Status: AC
  Filled 2013-07-16: qty 30

## 2013-07-16 MED ORDER — DIPHENHYDRAMINE HCL 50 MG/ML IJ SOLN
INTRAMUSCULAR | Status: AC
  Filled 2013-07-16: qty 1

## 2013-07-16 MED ORDER — ONDANSETRON HCL 4 MG/2ML IJ SOLN
INTRAMUSCULAR | Status: AC
  Filled 2013-07-16: qty 2

## 2013-07-16 MED ORDER — LIDOCAINE HCL (CARDIAC) 20 MG/ML IV SOLN
INTRAVENOUS | Status: AC
  Filled 2013-07-16: qty 5

## 2013-07-16 MED ORDER — DIBUCAINE 1 % RE OINT: 1.0000 "application " | TOPICAL_OINTMENT | RECTAL | Status: AC | PRN

## 2013-07-16 MED ORDER — LACTATED RINGERS IV SOLN
INTRAVENOUS | Status: DC | PRN
  Administered 2013-07-16: 11:00:00 via INTRAVENOUS

## 2013-07-16 MED ORDER — DIBUCAINE 1 % RE OINT: 1.0000 "application " | TOPICAL_OINTMENT | RECTAL | Status: DC | PRN

## 2013-07-16 MED ORDER — PROPOFOL 10 MG/ML IV BOLUS
INTRAVENOUS | Status: AC
  Filled 2013-07-16: qty 20

## 2013-07-16 MED ORDER — DEXTROSE 5 % IV SOLN
INTRAVENOUS | Status: AC
  Filled 2013-07-16: qty 2

## 2013-07-16 MED ORDER — FENTANYL CITRATE 0.05 MG/ML IJ SOLN
INTRAMUSCULAR | Status: AC
  Filled 2013-07-16: qty 5

## 2013-07-16 MED ORDER — DEXAMETHASONE SODIUM PHOSPHATE 10 MG/ML IJ SOLN
INTRAMUSCULAR | Status: AC
  Filled 2013-07-16: qty 1

## 2013-07-16 SURGICAL SUPPLY — 33 items
BLADE HEX COATED 2.75 (ELECTRODE) ×3 IMPLANT
BLADE SURG 15 STRL LF DISP TIS (BLADE) IMPLANT
BLADE SURG 15 STRL SS (BLADE)
BNDG GAUZE ELAST 4 BULKY (GAUZE/BANDAGES/DRESSINGS) ×3 IMPLANT
CANISTER SUCTION 2500CC (MISCELLANEOUS) IMPLANT
DECANTER SPIKE VIAL GLASS SM (MISCELLANEOUS) IMPLANT
DRAPE LAPAROTOMY T 102X78X121 (DRAPES) IMPLANT
DRAPE LG THREE QUARTER DISP (DRAPES) IMPLANT
DRSG PAD ABDOMINAL 8X10 ST (GAUZE/BANDAGES/DRESSINGS) IMPLANT
ELECT REM PT RETURN 9FT ADLT (ELECTROSURGICAL) ×3
ELECTRODE REM PT RTRN 9FT ADLT (ELECTROSURGICAL) ×1 IMPLANT
GAUZE SPONGE 4X4 16PLY XRAY LF (GAUZE/BANDAGES/DRESSINGS) ×3 IMPLANT
GLOVE BIO SURGEON STRL SZ7.5 (GLOVE) ×9 IMPLANT
GLOVE BIOGEL PI IND STRL 7.0 (GLOVE) ×3 IMPLANT
GLOVE BIOGEL PI INDICATOR 7.0 (GLOVE) ×6
GOWN STRL REUS W/ TWL XL LVL3 (GOWN DISPOSABLE) ×1 IMPLANT
GOWN STRL REUS W/TWL LRG LVL3 (GOWN DISPOSABLE) ×3 IMPLANT
GOWN STRL REUS W/TWL XL LVL3 (GOWN DISPOSABLE) ×5 IMPLANT
KIT BASIN OR (CUSTOM PROCEDURE TRAY) ×3 IMPLANT
LUBRICANT JELLY K Y 4OZ (MISCELLANEOUS) ×3 IMPLANT
NDL SAFETY ECLIPSE 18X1.5 (NEEDLE) IMPLANT
NEEDLE HYPO 18GX1.5 SHARP (NEEDLE)
NEEDLE HYPO 25X1 1.5 SAFETY (NEEDLE) ×3 IMPLANT
NS IRRIG 1000ML POUR BTL (IV SOLUTION) ×3 IMPLANT
PACK LITHOTOMY IV (CUSTOM PROCEDURE TRAY) ×3 IMPLANT
PENCIL BUTTON HOLSTER BLD 10FT (ELECTRODE) ×3 IMPLANT
SPONGE GAUZE 4X4 12PLY (GAUZE/BANDAGES/DRESSINGS) IMPLANT
SPONGE SURGIFOAM ABS GEL 12-7 (HEMOSTASIS) ×3 IMPLANT
SUT CHROMIC 2 0 SH (SUTURE) ×6 IMPLANT
SUT CHROMIC 3 0 SH 27 (SUTURE) IMPLANT
SYR CONTROL 10ML LL (SYRINGE) ×3 IMPLANT
TOWEL OR 17X26 10 PK STRL BLUE (TOWEL DISPOSABLE) ×3 IMPLANT
YANKAUER SUCT BULB TIP 10FT TU (MISCELLANEOUS) ×3 IMPLANT

## 2013-07-16 NOTE — Interval H&P Note (Signed)
History and Physical Interval Note:  07/16/2013 10:24 AM  Tyler Dawson  has presented today for surgery, with the diagnosis of HEMORRHOID  The various methods of treatment have been discussed with the patient and family. After consideration of risks, benefits and other options for treatment, the patient has consented to  Procedure(s): DEBRIEMENT OF HEMORRHOID (N/A) as a surgical intervention .  The patient's history has been reviewed, patient examined, no change in status, stable for surgery.  I have reviewed the patient's chart and labs.  Questions were answered to the patient's satisfaction.     TOTH III,PAUL S

## 2013-07-16 NOTE — Op Note (Signed)
07/15/2013 - 07/16/2013  11:59 AM  PATIENT:  Tyler Dawson  43 y.o. male  PRE-OPERATIVE DIAGNOSIS:  HEMORRHOID  POST-OPERATIVE DIAGNOSIS:  Thrombosed Hemorrhoids  PROCEDURE:  Procedure(s): HEMORRHOIDECTOMY (N/A) 1 column  SURGEON:  Surgeon(s) and Role:    * Robyne AskewPaul S Toth III, MD - Primary  PHYSICIAN ASSISTANT:   ASSISTANTS: none   ANESTHESIA:   general  EBL:  Total I/O In: -  Out: 420 [Urine:400; Blood:20]  BLOOD ADMINISTERED:none  DRAINS: none   LOCAL MEDICATIONS USED:  MARCAINE     SPECIMEN:  Source of Specimen:  hemorrhoid  DISPOSITION OF SPECIMEN:  PATHOLOGY  COUNTS:  YES  TOURNIQUET:  * No tourniquets in log *  DICTATION: .Dragon Dictation After informed consent was obtained the patient was brought to the operating room and placed in the supine position on the operating table. After adequate induction of general anesthesia the patient was moved in the lithotomy position and all pressure points are padded. His perirectal area was then prepped with Betadine and draped in usual sterile manner. The perirectal area was then infiltrated with quarter percent Marcaine with epinephrine and 1 cc of Wydase and the tissue was massaged gently for several minutes. The rectum and the rectum was examined circumferentially. There were circumferential moderate size internal and external hemorrhoids. In the right posterior lateral position there was a moderate to large sized thrombosed internal and external hemorrhoid with some ischemic changes. This area was elevated with Allis clamps. The base of the hemorrhoid was scored with a 15 blade knife. A hemostat was placed across the base of the hemorrhoid. The hemorrhoid distal to the hemostat was then excised sharply with Metzenbaum scissors and sent to pathology. The incision was then closed partially three quarters of the way from inside to out with a 2-0 chromic stitch. The last few millimeters of the incision was left open on the  outside and was fulgurated with cautery until it was hemostatic. Once this was accomplished the incision was examined and all areas were hemostatic. A Gelfoam roll coated with dibucaine ointment was placed inside the rectum. The outside of the rectum was coated with dibucaine ointment. Sterile dressings were applied. The patient tolerated the procedure well. At the end of the case all needle sponge and instrument counts were correct. The patient was then awakened and taken to recovery in stable condition.  PLAN OF CARE: Admit to inpatient   PATIENT DISPOSITION:  PACU - hemodynamically stable.   Delay start of Pharmacological VTE agent (>24hrs) due to surgical blood loss or risk of bleeding: no

## 2013-07-16 NOTE — Discharge Instructions (Signed)
Hemorrhoidectomy Hemorrhoidectomy is surgery to remove hemorrhoids. Hemorrhoids are veins that have become swollen in the rectum. The rectum is the area from the bottom end of the intestines to the opening where bowel movements leave the body. Hemorrhoids can be uncomfortable. They can cause itching, bleeding and pain if a blood clot forms in them (thrombose). If hemorrhoids are small, surgery may not be needed. But if they cover a larger area, surgery is usually suggested.  LET YOUR CAREGIVER KNOW ABOUT:   Any allergies.  All medications you are taking, including:  Herbs, eyedrops, over-the-counter medications and creams.  Blood thinners (anticoagulants), aspirin or other drugs that could affect blood clotting.  Use of steroids (by mouth or as creams).  Previous problems with anesthetics, including local anesthetics.  Possibility of pregnancy, if this applies.  Any history of blood clots.  Any history of bleeding or other blood problems.  Previous surgery.  Smoking history.  Other health problems. RISKS AND COMPLICATIONS All surgery carries some risk. However, hemorrhoid surgery usually goes smoothly. Possible complications could include:  Urinary retention.  Bleeding.  Infection.  A painful incision.  A reaction to the anesthesia (this is not common). BEFORE THE PROCEDURE   Stop using aspirin and non-steroidal anti-inflammatory drugs (NSAIDs) for pain relief. This includes prescription drugs and over-the-counter drugs such as ibuprofen and naproxen. Also stop taking vitamin E. If possible, do this two weeks before your surgery.  If you take blood-thinners, ask your healthcare provider when you should stop taking them.  You will probably have blood and urine tests done several days before your surgery.  Do not eat or drink for about 8 hours before the surgery.  Arrive at least an hour before the surgery, or whenever your surgeon recommends. This will give you time to  check in and fill out any needed paperwork.  Hemorrhoidectomy is often an outpatient procedure. This means you will be able to go home the same day. Sometimes, though, people stay overnight in the hospital after the procedure. Ask your surgeon what to expect. Either way, make arrangements in advance for someone to drive you home. PROCEDURE   The preparation:  You will change into a hospital gown.  You will be given an IV. A needle will be inserted in your arm. Medication can flow directly into your body through this needle.  You might be given an enema to clear your rectum.  Once in the operating room, you will probably lie on your side or be repositioned later to lying on your stomach.  You will be given anesthesia (medication) so you will not feel anything during the surgery. The surgery often is done with local anesthesia (the area near the hemorrhoids will be numb and you will be drowsy but awake). Sometimes, general anesthesia is used (you will be asleep during the procedure).  The procedure:  There are a few different procedures for hemorrhoids. Be sure to ask you surgeon about the procedure, the risks and benefits.  Be sure to ask about what you need to do to take care of the wound, if there is one. AFTER THE PROCEDURE  You will stay in a recovery area until the anesthesia has worn off. Your blood pressure and pulse will be checked every so often.  You may feel a lot of pain in the area of the rectum.  Take all pain medication prescribed by your surgeon. Ask before taking any over-the-counter pain medicines.  Sometimes sitting in a warm bath can help relieve  your pain.  To make sure you have bowel movements without straining:  You will probably need to take stool softeners (usually a pill) for a few days.  You should drink 8 to 10 glasses of water each day.  Your activity will be restricted for awhile. Ask your caregiver for a list of what you should and should not do  while you recover. Document Released: 02/04/2009 Document Revised: 07/02/2011 Document Reviewed: 02/04/2009 [facility] Patient Information 2014 [place], [place].   ANORECTAL SURGERY: POST OP INSTRUCTIONS  1. Take your usually prescribed home medications unless otherwise directed. 2. DIET: Follow a light bland diet the first 24 hours after arrival home, such as soup, liquids, crackers, etc.  Be sure to include lots of fluids daily.  Avoid fast food or heavy meals as your are more likely to get nauseated.  Eat a low fat the next few days after surgery.   3. PAIN CONTROL: a. Pain is best controlled by a usual combination of three different methods TOGETHER: i. Ice/Heat ii. Over the counter pain medication iii. Prescription pain medication b. Most patients will experience some swelling and discomfort in the anus/rectal area. and incisions.  Ice packs or heat (30-60 minutes up to 6 times a day) will help. Use ice for the first few days to help decrease swelling and bruising, then switch to heat such as warm towels, sitz baths, warm baths, etc to help relax tight/sore spots and speed recovery.  Some people prefer to use ice alone, heat alone, alternating between ice & heat.  Experiment to what works for you.  Swelling and bruising can take several weeks to resolve.   c. It is helpful to take an over-the-counter pain medication regularly for the first few weeks.  Choose one of the following that works best for you: i. Naproxen (Aleve, etc)  Two 220mg  tabs twice a day ii. Ibuprofen (Advil, etc) Three 200mg  tabs four times a day (every meal & bedtime) iii. Acetaminophen (Tylenol, etc) 500-650mg  four times a day (every meal & bedtime) d. A  prescription for pain medication (such as oxycodone, hydrocodone, etc) should be given to you upon discharge.  Take your pain medication as prescribed.  i. If you are having problems/concerns with the prescription medicine (does not control pain, nausea, vomiting, rash,  itching, etc), please call us [phone] to see if we need to switch you to a different pain medicine that will work better for you and/or control your side effect better. ii. If you need a refill on your pain medication, please contact your pharmacy.  They will contact our office to request authorization. Prescriptions will not be filled after 5 pm or on week-ends.  Use a Sitz Bath 4-8 times a day for relief A sitz bath is a warm water bath taken in the sitting position that covers only the hips and buttocks. It may be used for either healing or hygiene purposes. Sitz baths are also used to relieve pain, itching, or muscle spasms. The water may contain medicine. Moist heat will help you heal and relax.  HOME CARE INSTRUCTIONS  Take 3 to 4 sitz baths a day. 1. Fill the bathtub half full with warm water. 2. Sit in the water and open the drain a little. 3. Turn on the warm water to keep the tub half full. Keep the water running constantly. 4. Soak in the water for 15 to 20 minutes. 5. After the sitz bath, pat the affected area dry first. [facility]  IF:  You get worse instead of better. Stop the sitz baths if you get worse.   4. KEEP YOUR BOWELS REGULAR a. The goal is one bowel movement a day b. Avoid getting constipated.  Between the surgery and the pain medications, it is common to experience some constipation.  Increasing fluid intake and taking a fiber supplement (such as Metamucil, Citrucel, FiberCon, MiraLax, etc) 1-2 times a day regularly will usually help prevent this problem from occurring.  A mild laxative (prune juice, Milk of Magnesia, MiraLax, etc) should be taken according to package directions if there are no bowel movements after 48 hours. c. Watch out for diarrhea.  If you have many loose bowel movements, simplify your diet to bland foods & liquids for a few days.  Stop any stool softeners and decrease your fiber supplement.  Switching to mild anti-diarrheal medications  (Kayopectate, Pepto Bismol) can help.  If this worsens or does not improve, please call us.  5. Wound Care a. Remove your bandages the day after surgery.  Unless discharge instructions indicate otherwise, leave your bandage dry and in place overnight.  Remove the bandage during your first bowel movement.   b. Allow the wound packing to fall out over the next few days.  You can trim exposed gauze / ribbon as it falls out.  You do not need to repack the wound unless instructed otherwise.  Wear an absorbent pad or soft cotton gauze in your underwear as needed to catch any drainage and help keep the area  c. Keep the area clean and dry.  Bathe / shower every day.  Keep the area clean by showering / bathing over the incision / wound.   It is okay to soak an open wound to help wash it.  Wet wipes or showers / gentle washing after bowel movements is often less traumatic than regular toilet paper. d. Bonita Quin may have some styrofoam-like soft packing in the rectum which will come out with the first bowel movement.  e. You will often notice bleeding with bowel movements.  This should slow down by the end of the first week of surgery f. Expect some drainage.  This should slow down, too, by the end of the first week of surgery.  Wear an absorbent pad or soft cotton gauze in your underwear until the drainage stops. 6. ACTIVITIES as tolerated:   a. You may resume regular (light) daily activities beginning the next day--such as daily self-care, walking, climbing stairs--gradually increasing activities as tolerated.  If you can walk 30 minutes without difficulty, it is safe to try more intense activity such as jogging, treadmill, bicycling, low-impact aerobics, swimming, etc. b. Save the most intensive and strenuous activity for last such as sit-ups, heavy lifting, contact sports, etc  Refrain from any heavy lifting or straining until you are off narcotics for pain control.   c. DO NOT PUSH THROUGH PAIN.  Let pain be your  guide: If it hurts to do something, don't do it.  Pain is your body warning you to avoid that activity for another week until the pain goes down. d. You may drive when you are no longer taking prescription pain medication, you can comfortably sit for long periods of time, and you can safely maneuver your car and apply brakes. e. Bonita Quin may have sexual intercourse when it is comfortable.  7. FOLLOW UP in our office a. Please call [facility] at [phone] to set up an appointment to see your surgeon in the  office for a follow-up appointment approximately 2 weeks after your surgery. b. Make sure that you call for this appointment the day you arrive home to insure a convenient appointment time. 10. IF YOU HAVE DISABILITY OR FAMILY LEAVE FORMS, BRING THEM TO THE OFFICE FOR PROCESSING.  DO NOT GIVE THEM TO YOUR DOCTOR.        WHEN TO CALL US [phone]: 1. Poor pain control 2. Reactions / problems with new medications (rash/itching, nausea, etc)  3. Fever over 101.5 F (38.5 C) 4. Inability to urinate 5. Nausea and/or vomiting 6. Worsening swelling or bruising 7. Continued bleeding from incision. 8. Increased pain, redness, or drainage from the incision  The clinic staff is available to answer your questions during regular business hours (8:30am-5pm).  Please dont hesitate to call and ask to speak to one of our nurses for clinical concerns.   [facility] is always on call at the hospitals   If you have a medical emergency, go to the nearest emergency room or call 911.    [facility] [address], [address], [place], [place]  [address] ? MAIN: [phone] ? TOLL FREE: [phone] ? FAX [phone] [email or web address]

## 2013-07-16 NOTE — Progress Notes (Signed)
PACU note------pt has reddened areas around mouth, nose, ears, upper chest; denies itching, no resp distress noted; pt's allergies noted, Dr. Rica MastFortune, anesthes, called to check pt

## 2013-07-16 NOTE — Anesthesia Procedure Notes (Signed)
Procedure Name: LMA Insertion Date/Time: 07/16/2013 11:17 AM Performed by: Edison PaceGRAY, Chevelle Durr E Pre-anesthesia Checklist: Patient identified, Patient being monitored, Emergency Drugs available, Timeout performed and Suction available Patient Re-evaluated:Patient Re-evaluated prior to inductionOxygen Delivery Method: Circle system utilized Preoxygenation: Pre-oxygenation with 100% oxygen Intubation Type: IV induction LMA: LMA inserted LMA Size: 4.0 Number of attempts: 1 Placement Confirmation: positive ETCO2 Tube secured with: Tape Dental Injury: Teeth and Oropharynx as per pre-operative assessment

## 2013-07-16 NOTE — Anesthesia Postprocedure Evaluation (Signed)
Anesthesia Post Note  Patient: Tyler PollackChristophe Dawson  Procedure(s) Performed: Procedure(s) (LRB): HEMORRHOIDECTOMY (N/A)  Anesthesia type: General  Patient location: PACU  Post pain: Pain level controlled  Post assessment: Post-op Vital signs reviewed  Last Vitals:  Filed Vitals:   07/16/13 1314  BP: 160/99  Pulse: 56  Temp: 36.9 C  Resp: 14    Post vital signs: Reviewed  Level of consciousness: sedated  Complications: No apparent anesthesia complications

## 2013-07-16 NOTE — H&P (View-Only) (Signed)
Chief Complaint  Patient presents with  . Hemorrhoids    new pt    HISTORY: Tyler Dawson is a 43 y.o. male who presents to the office with anal pain.  He started having pain on Fri.  He went to the ED on Sun and had a thrombosed hemorrhoid lanced.  Other symptoms include bleeding, swelling and pressure.  He has tried tub soaks so far.  Nothing makes the symptoms worse.   It is continuous in nature.  His bowel habits are irregular and his bowel movements are usually hard, requiring straining occasionally.  His fiber intake is dietary.  He has never had a colonoscopy.    Past Medical History  Diagnosis Date  . Asthma       History reviewed. No pertinent past surgical history.      Current Outpatient Prescriptions  Medication Sig Dispense Refill  . docusate sodium (COLACE) 100 MG capsule Take 1 capsule (100 mg total) by mouth every 12 (twelve) hours.  60 capsule  0  . oxyCODONE-acetaminophen (PERCOCET) 5-325 MG per tablet Take 2 tablets by mouth every 4 (four) hours as needed.  20 tablet  0  . loratadine (CLARITIN) 10 MG tablet Take 10 mg by mouth daily as needed for allergies.      . petrolatum-hydrophilic-aloe vera (ALOE VESTA) ointment Apply 1 application topically 2 (two) times daily as needed (hemorrhoids).       No current facility-administered medications for this visit.      Allergies  Allergen Reactions  . Aspirin Anaphylaxis and Hives  . Ibuprofen Anaphylaxis and Hives      Family History  Problem Relation Age of Onset  . Cancer Mother     History   Social History  . Marital Status: Divorced    Spouse Name: N/A    Number of Children: N/A  . Years of Education: N/A   Social History Main Topics  . Smoking status: Current Some Day Smoker    Types: Cigarettes    Last Attempt to Quit: 06/22/2011  . Smokeless tobacco: Never Used  . Alcohol Use: No     Comment: occasionally  . Drug Use: No  . Sexual Activity: None   Other Topics Concern  . None    Social History Narrative  . None      REVIEW OF SYSTEMS - PERTINENT POSITIVES ONLY: Review of Systems - General ROS: negative for - chills, fever or weight loss Hematological and Lymphatic ROS: negative for - bleeding problems, blood clots or bruising Respiratory ROS: no cough, shortness of breath, or wheezing Cardiovascular ROS: no chest pain or dyspnea on exertion Gastrointestinal ROS: positive for - abdominal pain, blood in stools and constipation negative for - nausea/vomiting Genito-Urinary ROS: no dysuria, trouble voiding, or hematuria  EXAM: Filed Vitals:   07/15/13 1106  BP: 126/78  Pulse: 78  Temp: 98.4 F (36.9 C)  Resp: 16    General appearance: alert and cooperative Resp: clear to auscultation bilaterally Cardio: regular rate and rhythm GI: normal findings: soft, non-tender  Anal Exam Findings: Necrotic, thrombosed internal hemorrhoid, edematous and enlarged left lateral external hemorrhoids. I do not see any obvious external thrombosis. Unable to perform a internal exam due to pain.    ASSESSMENT AND PLAN: Tyler Dawson Is a 43-year-old male who presents to the office with a grade 4 internal hemorrhoid. We discussed the risk of doing nothing versus surgical resection. We discussed the surgical resection will usually make the pain worse before it's better. We   discussed the risk of necrosis and sepsis if no operation is performed. He would like to get this done as soon as possible. I discussed this with Dr. Toth at [facility]. He is agreeable to debriding this in the operating room tomorrow. We will get him admitted to the hospital and on clear liquids and MiraLax and sitz bath to assist with swelling and pain.    Breianna Delfino C Tykeria Wawrzyniak, MD Colon and Rectal Surgery / General Surgery [facility]      Visit Diagnoses: No diagnosis found.  Primary Care Physician: OSEI-BONSU,GEORGE, MD   

## 2013-07-16 NOTE — Anesthesia Preprocedure Evaluation (Addendum)
Anesthesia Evaluation  Patient identified by MRN, date of birth, ID band Patient awake    Reviewed: Allergy & Precautions, H&P , NPO status , Patient's Chart, lab work & pertinent test results  Airway Mallampati: II TM Distance: >3 FB Neck ROM: Full    Dental  (+) Dental Advisory Given, Teeth Intact   Pulmonary asthma , Current Smoker,  breath sounds clear to auscultation        Cardiovascular hypertension, Pt. on medications Rhythm:Regular Rate:Normal     Neuro/Psych negative neurological ROS  negative psych ROS   GI/Hepatic negative GI ROS, Neg liver ROS,   Endo/Other  negative endocrine ROS  Renal/GU negative Renal ROS     Musculoskeletal negative musculoskeletal ROS (+)   Abdominal   Peds  Hematology negative hematology ROS (+)   Anesthesia Other Findings   Reproductive/Obstetrics                          Anesthesia Physical Anesthesia Plan  ASA: II  Anesthesia Plan: General   Post-op Pain Management:    Induction: Intravenous  Airway Management Planned: LMA  Additional Equipment:   Intra-op Plan:   Post-operative Plan: Extubation in OR  Informed Consent: I have reviewed the patients History and Physical, chart, labs and discussed the procedure including the risks, benefits and alternatives for the proposed anesthesia with the patient or authorized representative who has indicated his/her understanding and acceptance.   Dental advisory given  Plan Discussed with: CRNA  Anesthesia Plan Comments:        Anesthesia Quick Evaluation

## 2013-07-16 NOTE — Progress Notes (Signed)
Dr. Rica MastFortune in to see pt, order rec'd and med given

## 2013-07-16 NOTE — Progress Notes (Signed)
UR completed 

## 2013-07-16 NOTE — Transfer of Care (Signed)
Immediate Anesthesia Transfer of Care Note  Patient: Tyler PollackChristophe Dawson  Procedure(s) Performed: Procedure(s): HEMORRHOIDECTOMY (N/A)  Patient Location: PACU  Anesthesia Type:General  Level of Consciousness: awake, alert , oriented and patient cooperative  Airway & Oxygen Therapy: Patient Spontanous Breathing and Patient connected to face mask oxygen  Post-op Assessment: Report given to PACU RN, Post -op Vital signs reviewed and stable and Patient moving all extremities  Post vital signs: Reviewed and stable  Complications: No apparent anesthesia complications

## 2013-07-16 NOTE — Preoperative (Signed)
Beta Blockers   Reason not to administer Beta Blockers:Not Applicable 

## 2013-07-17 ENCOUNTER — Encounter (HOSPITAL_COMMUNITY): Payer: Self-pay | Admitting: General Surgery

## 2013-07-17 NOTE — Discharge Summary (Signed)
Physician Discharge Summary  Patient ID: Stepen Prins MRN: [id number] DOB/AGE: 1970-12-14 43 y.o.  Admit date: 07/15/2013 Discharge date: 07/17/2013  Admission Diagnoses:  Thrombosed Hemorrhoids   Discharge Diagnoses:  Active Problems:   Prolapsed internal hemorrhoids, grade 4   PROCEDURES: HEMORRHOIDECTOMY, 1 column, 07/16/2013, Robyne Askew, MD     Hospital Course: Tyler Dawson is a 43 y.o. male who presents to the office with anal pain. He started having pain on Fri. He went to the ED on Sun and had a thrombosed hemorrhoid lanced. Other symptoms include bleeding, swelling and pressure. He has tried tub soaks so far. Nothing makes the symptoms worse. It is continuous in nature. His bowel habits are irregular and his bowel movements are usually hard, requiring straining occasionally. His fiber intake is dietary. He has never had a colonoscopy.  He was admitted and taken to the OR.  He did well and went home that evening. He will follow up with Dr. Maisie Fus.   Disposition: 01-Home or Self Care     Medication List    STOP taking these medications       oxyCODONE-acetaminophen 5-325 MG per tablet  Commonly known as:  PERCOCET      TAKE these medications       acetaminophen 500 MG tablet  Commonly known as:  TYLENOL  Take 1,000 mg by mouth every 6 (six) hours as needed for mild pain or headache.     diazepam 5 MG tablet  Commonly known as:  VALIUM  Take 1 tablet (5 mg total) by mouth every 6 (six) hours as needed (inability to urinate).     diazepam 5 MG tablet  Commonly known as:  VALIUM  Take 1 tablet (5 mg total) by mouth every 6 (six) hours as needed (inability to urinate).     dibucaine 1 % Oint  Commonly known as:  NUPERCAINAL  Place 1 application rectally as needed for pain.     diphenhydrAMINE 25 MG tablet  Commonly known as:  BENADRYL  Take 25 mg by mouth every 6 (six) hours as needed for allergies.     docusate sodium 100 MG capsule   Commonly known as:  COLACE  Take 1 capsule (100 mg total) by mouth every 12 (twelve) hours.     lidocaine 5 % ointment  Commonly known as:  XYLOCAINE  Apply 1 application topically as needed.     loratadine 10 MG tablet  Commonly known as:  CLARITIN  Take 10 mg by mouth daily as needed for allergies.     multivitamin with minerals Tabs tablet  Take 1 tablet by mouth daily.     oxyCODONE 5 MG immediate release tablet  Commonly known as:  Oxy IR/ROXICODONE  1-2 tablets every 4 hours as needed.  This will add to your constipation.     polyethylene glycol packet  Commonly known as:  MIRALAX / GLYCOLAX  - Use 1-2 times daily as needed.  You want to keep it so you have 1-2 soft bowel movements per day.  To much will make your stool to loose or to frequent.  That will keep you in the toilet to long also.  - You can buy this over the counter.           Follow-up Information   Follow up with Vanita Panda., MD. Schedule an appointment as soon as possible for a visit in 2 weeks.   Specialty:  General Surgery   Contact information:  [address] [place] [address] [phone]       Signed: Sherrie GeorgeJENNINGS,Lyndel Sarate 07/17/2013, 6:43 PM

## 2013-07-19 ENCOUNTER — Telehealth (INDEPENDENT_AMBULATORY_CARE_PROVIDER_SITE_OTHER): Payer: Self-pay | Admitting: Surgery

## 2013-07-19 NOTE — Telephone Encounter (Signed)
Tyler Dawson  11/06/1970 [id number]  Patient Care Team: Jackie Plum, MD as PCP - General (Internal Medicine)  This patient is a 43 y.o.male who calls today for surgical evaluation.   Date of procedure/visit: 07/16/2013  Surgery:   POST-OPERATIVE DIAGNOSIS: Thrombosed Hemorrhoids  PROCEDURE: Procedure(s):  HEMORRHOIDECTOMY (N/A) 1 column  SURGEON: Surgeon(s) and Role:  * Robyne Askew, MD - Primary      Reason for call: Postoperative complaint  Patient status post removal of thrombosed hemorrhoid 2 days ago.  Had difficulty with urination.  Started on Valium.  Makes him feel "jacked up".  However he feels that is the best thing for him.  Taking narcotics.  Oxycodone.  Doingwarm soaks.  Had bowel movement but was very painful.  Concerned him.  No nausea.  No vomiting.  Not getting out.  No fevers or chills.  No significant rectal bleeding.  Taking MiraLAX.  Taking stool softener.  I recommend he take nonsteroidal.  He told me he gets a rash with NSAIDs.  Recommend he take extra strength Tylenol.  1 g by mouth q. A.c./each bedtime.  Oxycodone 5-15 every 4 hours.  He has MiraLAX only twice a day.  Increase of 8 times a day.  Tried topical Preparation H or Anusol.  I noted I cannot call in narcotics.  Only option is emergency room if this does not work for him.  I tried to dissuade him from using Valium indefinitely.  Recommended warm showers or baths to help relax bladder spasms and help him urinate.  He seems somewhat reassured.  Patient Active Problem List   Diagnosis Date Noted  . Prolapsed internal hemorrhoids, grade 4 07/15/2013    Past Medical History  Diagnosis Date  . Asthma   . Sleep apnea   . Hypertension     Past Surgical History  Procedure Laterality Date  . Hemorrhoid surgery N/A 07/16/2013    Procedure: HEMORRHOIDECTOMY;  Surgeon: Robyne Askew, MD;  Location: [facility];  Service: General;  Laterality: N/A;    History   Social History  . Marital  Status: Divorced    Spouse Name: N/A    Number of Children: N/A  . Years of Education: N/A   Occupational History  . Not on file.   Social History Main Topics  . Smoking status: Current Some Day Smoker -- 0.25 packs/day    Types: Cigarettes    Last Attempt to Quit: 06/22/2011  . Smokeless tobacco: Never Used  . Alcohol Use: No     Comment: occasionally  . Drug Use: No  . Sexual Activity: Yes   Other Topics Concern  . Not on file   Social History Narrative  . No narrative on file    Family History  Problem Relation Age of Onset  . Cancer Mother     Current Outpatient Prescriptions  Medication Sig Dispense Refill  . acetaminophen (TYLENOL) 500 MG tablet Take 1,000 mg by mouth every 6 (six) hours as needed for mild pain or headache.      . diazepam (VALIUM) 5 MG tablet Take 1 tablet (5 mg total) by mouth every 6 (six) hours as needed (inability to urinate).  3 tablet  0  . diazepam (VALIUM) 5 MG tablet Take 1 tablet (5 mg total) by mouth every 6 (six) hours as needed (inability to urinate).  20 tablet  0  . dibucaine (NUPERCAINAL) 1 % OINT Place 1 application rectally as needed for pain.  60 g  1  .  diphenhydrAMINE (BENADRYL) 25 MG tablet Take 25 mg by mouth every 6 (six) hours as needed for allergies.      Marland Kitchen. docusate sodium (COLACE) 100 MG capsule Take 1 capsule (100 mg total) by mouth every 12 (twelve) hours.  60 capsule  0  . lidocaine (XYLOCAINE) 5 % ointment Apply 1 application topically as needed.  35.44 g  0  . loratadine (CLARITIN) 10 MG tablet Take 10 mg by mouth daily as needed for allergies.      . Multiple Vitamin (MULTIVITAMIN WITH MINERALS) TABS tablet Take 1 tablet by mouth daily.      Marland Kitchen. oxyCODONE (OXY IR/ROXICODONE) 5 MG immediate release tablet 1-2 tablets every 4 hours as needed.  This will add to your constipation.  60 tablet  0  . polyethylene glycol (MIRALAX / GLYCOLAX) packet Use 1-2 times daily as needed.  You want to keep it so you have 1-2 soft bowel  movements per day.  To much will make your stool to loose or to frequent.  That will keep you in the toilet to long also. You can buy this over the counter.  14 each  0   No current facility-administered medications for this visit.     Allergies  Allergen Reactions  . Aspirin Anaphylaxis and Hives  . Ibuprofen Anaphylaxis and Hives    @VS @  No results found.  Note: This dictation was prepared with Dragon/digital dictation along with [employer] technology. Any transcriptional errors that result from this process are unintentional.

## 2013-07-20 ENCOUNTER — Telehealth (INDEPENDENT_AMBULATORY_CARE_PROVIDER_SITE_OTHER): Payer: Self-pay

## 2013-07-20 DIAGNOSIS — Z8719 Personal history of other diseases of the digestive system: Secondary | ICD-10-CM

## 2013-07-20 DIAGNOSIS — Z9889 Other specified postprocedural states: Principal | ICD-10-CM

## 2013-07-20 MED ORDER — OXYCODONE HCL 5 MG PO TABS: ORAL_TABLET | ORAL | Status: DC

## 2013-07-20 NOTE — Addendum Note (Signed)
Addended by: Ethlyn GallerySPILLERS, Kellon Chalk on: 07/20/2013 04:28 PM   Modules accepted: Orders

## 2013-07-20 NOTE — Telephone Encounter (Signed)
Called pt back to let him know there was a misunderstanding on the refill for the pain medication. I advised pt that Dr Lindie SpruceWyatt did not refill the Valium but the Oxycodone 5mg  #60 is ok to be refilled. I have a written script for the Oxycodone 5mg  at the front desk. A copy made for chart. Pt will p/u tomorrow at the front desk.

## 2013-07-20 NOTE — Telephone Encounter (Signed)
Returned pt's call. Per Dr Lindie SpruceWyatt he denying refill request for pain medication Oxycodone and Valium. The pt is s/p hemorrhoidectomy 07/16/13 done by Dr Carolynne Edouardoth. The pt is very upset that he is not getting a refill on his pain medication. The pt will be out of his medication completely tomorrow. The pt wants to know what is he supposed to do since Dr Carolynne Edouardoth is out of the office all week. The pt wants Dr Billey Changoth's assistant to call him. I advised I would send her a message but the pt might have to call tomorrow to request the refill again by another physician in the office.

## 2013-07-20 NOTE — Telephone Encounter (Signed)
Patient is asking for refill of Valium and oxy IR rates pain 7-8 ,valuim is helping with urination. I advised we will call when RX is ready for P/U.

## 2013-07-23 ENCOUNTER — Ambulatory Visit (INDEPENDENT_AMBULATORY_CARE_PROVIDER_SITE_OTHER): Payer: Medicaid Other | Admitting: General Surgery

## 2013-08-03 ENCOUNTER — Ambulatory Visit (INDEPENDENT_AMBULATORY_CARE_PROVIDER_SITE_OTHER): Payer: Medicaid Other | Admitting: General Surgery

## 2013-08-03 ENCOUNTER — Encounter (INDEPENDENT_AMBULATORY_CARE_PROVIDER_SITE_OTHER): Payer: Self-pay | Admitting: General Surgery

## 2013-08-03 VITALS — BP 134/80 | HR 90 | Temp 97.4°F | Resp 18 | Ht 72.0 in | Wt 239.8 lb

## 2013-08-03 DIAGNOSIS — K648 Other hemorrhoids: Secondary | ICD-10-CM

## 2013-08-03 DIAGNOSIS — N50811 Right testicular pain: Secondary | ICD-10-CM

## 2013-08-03 DIAGNOSIS — N509 Disorder of male genital organs, unspecified: Secondary | ICD-10-CM

## 2013-08-03 DIAGNOSIS — K643 Fourth degree hemorrhoids: Secondary | ICD-10-CM

## 2013-08-03 NOTE — Progress Notes (Signed)
Subjective:     Patient ID: Tyler PollackChristophe Dawson, male   DOB: [birth year], 43 y.o.   MRN: [id number]  HPI The patient is a 43 year old white male who is about 3 weeks status post a 1 column hemorrhoidectomy. He tolerated the surgery well. He has minimal discomfort in his rectum. His appetite is good and his bowels are working normally. He denies any constipation. His only real complaint is of some mild tenderness of the right testicle. He has no problems with urination.  Review of Systems     Objective:   Physical Exam On exam his perirectal skin looks good. The operative site is clean and healing nicely. He has small external hemorrhoidal skin tags on today's exam that appear benign    Assessment:     The patient is 3 weeks status post one column hemorrhoidectomy     Plan:     At this point I would recommend that he continue to keep his rectum clean and dry. He is to avoid problems with constipation. I will refer him to urology for the right testicular discomfort. I will plan to see him back in about 2 months

## 2013-08-03 NOTE — Patient Instructions (Signed)
Keep rectum clean and dry

## 2013-10-15 ENCOUNTER — Ambulatory Visit (INDEPENDENT_AMBULATORY_CARE_PROVIDER_SITE_OTHER): Payer: Medicaid Other | Admitting: General Surgery

## 2014-02-26 ENCOUNTER — Encounter (HOSPITAL_COMMUNITY): Payer: Self-pay | Admitting: *Deleted

## 2014-02-26 ENCOUNTER — Emergency Department (HOSPITAL_COMMUNITY): Payer: No Typology Code available for payment source

## 2014-02-26 ENCOUNTER — Emergency Department (HOSPITAL_COMMUNITY)
Admission: EM | Admit: 2014-02-26 | Discharge: 2014-02-26 | Disposition: A | Discharge status: Home / Self Care | Payer: No Typology Code available for payment source | Attending: Emergency Medicine | Admitting: Emergency Medicine

## 2014-02-26 DIAGNOSIS — I10 Essential (primary) hypertension: Secondary | ICD-10-CM | POA: Insufficient documentation

## 2014-02-26 DIAGNOSIS — J45909 Unspecified asthma, uncomplicated: Secondary | ICD-10-CM | POA: Insufficient documentation

## 2014-02-26 DIAGNOSIS — Z8669 Personal history of other diseases of the nervous system and sense organs: Secondary | ICD-10-CM | POA: Insufficient documentation

## 2014-02-26 DIAGNOSIS — Z720 Tobacco use: Secondary | ICD-10-CM | POA: Insufficient documentation

## 2014-02-26 DIAGNOSIS — S63502A Unspecified sprain of left wrist, initial encounter: Secondary | ICD-10-CM

## 2014-02-26 DIAGNOSIS — S63602A Unspecified sprain of left thumb, initial encounter: Secondary | ICD-10-CM | POA: Diagnosis not present

## 2014-02-26 DIAGNOSIS — Y92410 Unspecified street and highway as the place of occurrence of the external cause: Secondary | ICD-10-CM | POA: Insufficient documentation

## 2014-02-26 DIAGNOSIS — S6992XA Unspecified injury of left wrist, hand and finger(s), initial encounter: Secondary | ICD-10-CM | POA: Diagnosis present

## 2014-02-26 DIAGNOSIS — S63501A Unspecified sprain of right wrist, initial encounter: Secondary | ICD-10-CM | POA: Insufficient documentation

## 2014-02-26 DIAGNOSIS — Y9389 Activity, other specified: Secondary | ICD-10-CM | POA: Insufficient documentation

## 2014-02-26 DIAGNOSIS — Z79899 Other long term (current) drug therapy: Secondary | ICD-10-CM | POA: Diagnosis not present

## 2014-02-26 MED ORDER — CYCLOBENZAPRINE HCL 10 MG PO TABS: 10.0000 mg | ORAL_TABLET | Freq: Two times a day (BID) | ORAL | Status: AC | PRN

## 2014-02-26 MED ORDER — TRAMADOL HCL 50 MG PO TABS: 50.0000 mg | ORAL_TABLET | Freq: Four times a day (QID) | ORAL | Status: AC | PRN
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
